# Patient Record
Sex: Male | Born: 1985 | Race: Asian | Hispanic: No | Marital: Single | Smoking: Never smoker
Health system: Southern US, Community
[De-identification: ages and names within clinical notes are randomized; demographics above are authoritative.]

## PROBLEM LIST (undated history)

## (undated) HISTORY — PX: HERNIA REPAIR: SHX51

---

## 2007-02-22 ENCOUNTER — Ambulatory Visit: Payer: Self-pay | Admitting: Internal Medicine

## 2007-12-31 ENCOUNTER — Ambulatory Visit: Payer: Self-pay | Admitting: Family Medicine

## 2008-03-16 ENCOUNTER — Ambulatory Visit: Payer: Self-pay | Admitting: Internal Medicine

## 2010-01-27 ENCOUNTER — Ambulatory Visit: Payer: Self-pay | Admitting: Internal Medicine

## 2010-06-06 ENCOUNTER — Ambulatory Visit: Payer: Self-pay | Admitting: Internal Medicine

## 2012-03-03 ENCOUNTER — Ambulatory Visit: Payer: Self-pay | Admitting: Medical

## 2015-07-04 ENCOUNTER — Encounter: Payer: Self-pay | Admitting: Emergency Medicine

## 2015-07-04 ENCOUNTER — Ambulatory Visit
Admission: EM | Admit: 2015-07-04 | Discharge: 2015-07-04 | Disposition: A | Discharge status: Home / Self Care | Payer: Self-pay | Attending: Family Medicine | Admitting: Family Medicine

## 2015-07-04 DIAGNOSIS — D729 Disorder of white blood cells, unspecified: Secondary | ICD-10-CM

## 2015-07-04 DIAGNOSIS — J111 Influenza due to unidentified influenza virus with other respiratory manifestations: Secondary | ICD-10-CM

## 2015-07-04 DIAGNOSIS — J029 Acute pharyngitis, unspecified: Secondary | ICD-10-CM

## 2015-07-04 LAB — RAPID INFLUENZA A&B ANTIGENS
Influenza A (ARMC): NOT DETECTED — AB
Influenza B (ARMC): NOT DETECTED — AB

## 2015-07-04 LAB — RAPID STREP SCREEN (MED CTR MEBANE ONLY): Streptococcus, Group A Screen (Direct): NEGATIVE

## 2015-07-04 MED ORDER — OSELTAMIVIR PHOSPHATE 75 MG PO CAPS: 75.0000 mg | ORAL_CAPSULE | Freq: Two times a day (BID) | ORAL | Status: DC

## 2015-07-04 MED ORDER — FEXOFENADINE-PSEUDOEPHED ER 180-240 MG PO TB24: 1.0000 | ORAL_TABLET | Freq: Every day | ORAL | Status: DC

## 2015-07-04 MED ORDER — HYDROCOD POLST-CPM POLST ER 10-8 MG/5ML PO SUER: 5.0000 mL | Freq: Two times a day (BID) | ORAL | Status: DC | PRN

## 2015-07-04 MED ORDER — AZITHROMYCIN 250 MG PO TABS: ORAL_TABLET | ORAL | Status: DC

## 2015-07-04 NOTE — Discharge Instructions (Signed)
Cough, Adult A cough helps to clear your throat and lungs. A cough may last only 2-3 weeks (acute), or it may last longer than 8 weeks (chronic). Many different things can cause a cough. A cough may be a sign of an illness or another medical condition. HOME CARE  Pay attention to any changes in your cough.  Take medicines only as told by your doctor.  If you were prescribed an antibiotic medicine, take it as told by your doctor. Do not stop taking it even if you start to feel better.  Talk with your doctor before you try using a cough medicine.  Drink enough fluid to keep your pee (urine) clear or pale yellow.  If the air is dry, use a cold steam vaporizer or humidifier in your home.  Stay away from things that make you cough at work or at home.  If your cough is worse at night, try using extra pillows to raise your head up higher while you sleep.  Do not smoke, and try not to be around smoke. If you need help quitting, ask your doctor.  Do not have caffeine.  Do not drink alcohol.  Rest as needed. GET HELP IF:  You have new problems (symptoms).  You cough up yellow fluid (pus).  Your cough does not get better after 2-3 weeks, or your cough gets worse.  Medicine does not help your cough and you are not sleeping well.  You have pain that gets worse or pain that is not helped with medicine.  You have a fever.  You are losing weight and you do not know why.  You have night sweats. GET HELP RIGHT AWAY IF:  You cough up blood.  You have trouble breathing.  Your heartbeat is very fast.   This information is not intended to replace advice given to you by your health care provider. Make sure you discuss any questions you have with your health care provider.   Document Released: 01/04/2011 Document Revised: 01/12/2015 Document Reviewed: 06/30/2014 Elsevier Interactive Patient Education 2016 Else[employer] Inc[employer]  Influenza, Adult Influenza (flu) is an infection in the mouth,  nose, and throat (respiratory tract) caused by a virus. The flu can make you feel very ill. Influenza spreads easily from person to person (contagious).  HOME CARE   Only take medicines as told by your doctor.  Use a cool mist humidifier to make breathing easier.  Get plenty of rest until your fever goes away. This usually takes 3 to 4 days.  Drink enough fluids to keep your pee (urine) clear or pale yellow.  Cover your mouth and nose when you cough or sneeze.  Wash your hands well to avoid spreading the flu.  Stay home from work or school until your fever has been gone for at least 1 full day.  Get a flu shot every year. GET HELP RIGHT AWAY IF:   You have trouble breathing or feel short of breath.  Your skin or nails turn blue.  You have severe neck pain or stiffness.  You have a severe headache, facial pain, or earache.  Your fever gets worse or keeps coming back.  You feel sick to your stomach (nauseous), throw up (vomit), or have watery poop (diarrhea).  You have chest pain.  You have a deep cough that gets worse, or you cough up more thick spit (mucus). MAKE SURE YOU:   Understand these instructions.  Will watch your condition.  Will get help right away if you  are not doing well or get worse.   This information is not intended to replace advice given to you by your health care provider. Make sure you discuss any questions you have with your health care provider.   Document Released: 01/31/2008 Document Revised: 05/14/2014 Document Reviewed: 07/23/2011 Elsevier Interactive Patient Education 2016 Else[employer] Inc.  Pharyngitis Pharyngitis is a sore throat (pharynx). There is redness, pain, and swelling of your throat. HOME CARE   Drink enough fluids to keep your pee (urine) clear or pale yellow.  Only take medicine as told by your doctor.  You may get sick again if you do not take medicine as told. Finish your medicines, even if you start to feel better.  Do  not take aspirin.  Rest.  Rinse your mouth (gargle) with salt water ( tsp of salt per 1 qt of water) every 1-2 hours. This will help the pain.  If you are not at risk for choking, you can suck on hard candy or sore throat lozenges. GET HELP IF:  You have large, tender lumps on your neck.  You have a rash.  You cough up green, yellow-brown, or bloody spit. GET HELP RIGHT AWAY IF:   You have a stiff neck.  You drool or cannot swallow liquids.  You throw up (vomit) or are not able to keep medicine or liquids down.  You have very bad pain that does not go away with medicine.  You have problems breathing (not from a stuffy nose). MAKE SURE YOU:   Understand these instructions.  Will watch your condition.  Will get help right away if you are not doing well or get worse.   This information is not intended to replace advice given to you by your health care provider. Make sure you discuss any questions you have with your health care provider.   Document Released: 10/10/2007 Document Revised: 02/11/2013 Document Reviewed: 12/29/2012 Elsevier Interactive Patient Education [employer]

## 2015-07-04 NOTE — ED Provider Notes (Signed)
CSN: [id number]     Arrival date & time 07/04/15  1711 History   None   Nurses notes were reviewed. Chief Complaint  Patient presents with  . Fever  . Cough  . Headache    Patient reports being well until Saturday when everything, hit him like a ton of bricks. He reports nasal congestion coughing fever and aching all over. He's also had a sore throat since the aggressive getting worse a few days before anything else hit him. 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 Unfortunately he has not followed up with hematologist oncologist in years. He is allergic to no medication. He did not get his flu shot despite having history of neutropenia.    (Consider location/radiation/quality/duration/timing/severity/associated sxs/prior Treatment) Patient is a 30 y.o. male presenting with fever, cough, and headaches. The history is provided by the patient. No language interpreter was used.  Fever Temp source:  Oral Onset quality:  Sudden Timing:  Constant Progression:  Unable to specify Relieved by:  Nothing Associated symptoms: congestion, cough, headaches and sore throat   Associated symptoms: no ear pain and no rash   Risk factors: immunosuppression   Cough Associated symptoms: fever, headaches and sore throat   Associated symptoms: no ear pain and no rash   Headache Associated symptoms: congestion, cough, fever and sore throat   Associated symptoms: no ear pain     History reviewed. No pertinent past medical history. History reviewed. No pertinent past surgical history. History reviewed. No pertinent family history. Social History  Substance Use Topics  . Smoking status: Never Smoker   . Smokeless tobacco: None  . Alcohol Use: Yes    Review of Systems  Constitutional: Positive for fever.  HENT: Positive for congestion and sore throat. Negative  for ear pain.   Respiratory: Positive for cough.   Skin: Negative for rash.  Neurological: Positive for headaches.  All other systems reviewed and are negative.   Allergies  Review of patient's allergies indicates no known allergies.  Home Medications   Prior to Admission medications   Medication Sig Start Date End Date Taking? Authorizing Provider  azithromycin (ZITHROMAX Z-PAK) 250 MG tablet Take 2 tablets first day and then 1 po a day for 4 days 07/04/15   Hassan Rowan, MD  chlorpheniramine-HYDROcodone [facility]) 10-8 MG/5ML SUER Take 5 mLs by mouth every 12 (twelve) hours as needed for cough. 07/04/15   Hassan Rowan, MD  fexofenadine-pseudoephedrine (ALLEGRA-D ALLERGY & CONGESTION) 180-240 MG 24 hr tablet Take 1 tablet by mouth daily. 07/04/15   Hassan Rowan, MD  oseltamivir (TAMIFLU) 75 MG capsule Take 1 capsule (75 mg total) by mouth 2 (two) times daily. 07/04/15   Hassan Rowan, MD   Meds Ordered and Administered this Visit  Medications - No data to display  BP 145/100 mmHg  Pulse 120  Temp(Src) 97.8 F (36.6 C) (Tympanic)  Resp 16  Ht  (1.651 m)  Wt 180 lb (81.647 kg)  BMI 29.95 kg/m2  SpO2 96% No data found.   Physical Exam  Constitutional: He is oriented to person, place, and time. He appears well-developed and well-nourished.  HENT:  Head: Normocephalic and atraumatic.  Right Ear: Hearing, tympanic membrane, external ear and ear canal normal.  Left Ear: Hearing, tympanic membrane, external ear and ear canal normal.  Nose: Mucosal edema present.  Eyes: Conjunctivae are normal. Pupils are  equal, round, and reactive to light.  Neck: Normal range of motion. Neck supple.  Cardiovascular: Normal rate, regular rhythm and normal heart sounds.   Pulmonary/Chest: Effort normal and breath sounds normal. No respiratory distress.  Musculoskeletal: Normal range of motion.  Lymphadenopathy:    He has cervical adenopathy.  Neurological: He is alert and oriented  to person, place, and time.  Skin: Skin is warm and dry.  Psychiatric: He has a normal mood and affect.  Vitals reviewed.   ED Course  Procedures (including critical care time)  Labs Review Labs Reviewed  RAPID INFLUENZA A&B ANTIGENS ([facility] ONLY) - Abnormal; Notable for the following:    Influenza A [facility]) NOT DETECTED (*)    Influenza B ([facility]) NOT DETECTED (*)    All other components within normal limits  RAPID STREP SCREEN (NOT AT [facility])  CULTURE, GROUP A STREP [facility])    Imaging Review No results found.   Visual Acuity Review  Right Eye Distance:   Left Eye Distance:   Bilateral Distance:    Right Eye Near:   Left Eye Near:    Bilateral Near:     Results for orders placed or performed during the hospital encounter of 07/04/15  Rapid Influenza A&B Antigens ([facility] only)  Result Value Ref Range   Influenza A ([facility]) NOT DETECTED (A) NEGATIVE   Influenza B ([facility]) NOT DETECTED (A) NEGATIVE  Rapid strep screen  Result Value Ref Range   Streptococcus, Group A Screen (Direct) NEGATIVE NEGATIVE      MDM   1. Flu syndrome   2. Pharyngitis   3. Abnormal white blood cell (WBC) count      Because of his history of neutropenia we'll go ahead and treat with an antibiotic Zithromax for the pharyngitis we'll place him on Tamiflu for what is suspected a flulike illness that started Saturday. Flu test was negative but supplied to him the flu test can be negative will give a work note for today and tomorrow as well. 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 Note: This dictation was prepared with Dragon dictation along with smaller phrase technology. Any transcriptional errors that result from this process are unintentional.  Hassan Rowan, MD 07/04/15 2106

## 2015-07-04 NOTE — ED Notes (Signed)
Patient c/o fever, HAs, and cough since Saturday.

## 2015-07-06 LAB — CULTURE, GROUP A STREP (THRC)

## 2019-10-17 ENCOUNTER — Other Ambulatory Visit: Payer: Self-pay

## 2019-10-17 ENCOUNTER — Ambulatory Visit (INDEPENDENT_AMBULATORY_CARE_PROVIDER_SITE_OTHER): Payer: Self-pay

## 2019-10-17 ENCOUNTER — Encounter: Payer: Self-pay | Admitting: Emergency Medicine

## 2019-10-17 ENCOUNTER — Ambulatory Visit
Admission: EM | Admit: 2019-10-17 | Discharge: 2019-10-17 | Disposition: A | Discharge status: Home / Self Care | Payer: Self-pay | Attending: Emergency Medicine | Admitting: Emergency Medicine

## 2019-10-17 DIAGNOSIS — S61259A Open bite of unspecified finger without damage to nail, initial encounter: Secondary | ICD-10-CM

## 2019-10-17 DIAGNOSIS — Z23 Encounter for immunization: Secondary | ICD-10-CM

## 2019-10-17 MED ORDER — TETANUS-DIPHTH-ACELL PERTUSSIS 5-2.5-18.5 LF-MCG/0.5 IM SUSP
0.5000 mL | Freq: Once | INTRAMUSCULAR | Status: AC
  Administered 2019-10-17: 0.5 mL via INTRAMUSCULAR

## 2019-10-17 MED ORDER — CEFTRIAXONE SODIUM 1 G IJ SOLR
1.0000 g | Freq: Once | INTRAMUSCULAR | Status: AC
  Administered 2019-10-17: 1 g via INTRAMUSCULAR

## 2019-10-17 MED ORDER — AMOXICILLIN-POT CLAVULANATE 875-125 MG PO TABS: 1.0000 | ORAL_TABLET | Freq: Two times a day (BID) | ORAL | 0 refills | Status: DC

## 2019-10-17 MED ORDER — MUPIROCIN 2 % EX OINT: 1.0000 "application " | TOPICAL_OINTMENT | Freq: Three times a day (TID) | CUTANEOUS | 0 refills | Status: DC

## 2019-10-17 NOTE — Discharge Instructions (Addendum)
Wet a washcloth under the faucet and then place in the microwave oven for 10 to 15 seconds.  Place the cloth over the finger leaving it there for 10 minutes.  Dry the area thoroughly and apply mupirocin ointment .  Perform this 3-4 times each and every day until the wound has resolved.  Be certain to take all of the antibiotic . Follow-up in our clinic in 2 days for wound recheck.  If symptoms worsen despite the use of the antibiotic go to the emergency room
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
## 2023-12-17 ENCOUNTER — Other Ambulatory Visit: Payer: Self-pay

## 2023-12-17 ENCOUNTER — Ambulatory Visit
Admission: EM | Admit: 2023-12-17 | Discharge: 2023-12-17 | Disposition: A | Discharge status: Home / Self Care | Attending: Physician Assistant | Admitting: Physician Assistant

## 2023-12-17 DIAGNOSIS — I10 Essential (primary) hypertension: Secondary | ICD-10-CM | POA: Insufficient documentation

## 2023-12-17 DIAGNOSIS — R0981 Nasal congestion: Secondary | ICD-10-CM | POA: Diagnosis not present

## 2023-12-17 DIAGNOSIS — R300 Dysuria: Secondary | ICD-10-CM | POA: Diagnosis present

## 2023-12-17 DIAGNOSIS — H1033 Unspecified acute conjunctivitis, bilateral: Secondary | ICD-10-CM | POA: Diagnosis not present

## 2023-12-17 DIAGNOSIS — Z113 Encounter for screening for infections with a predominantly sexual mode of transmission: Secondary | ICD-10-CM | POA: Insufficient documentation

## 2023-12-17 DIAGNOSIS — N390 Urinary tract infection, site not specified: Secondary | ICD-10-CM | POA: Insufficient documentation

## 2023-12-17 LAB — URINALYSIS, W/ REFLEX TO CULTURE (INFECTION SUSPECTED)
Bilirubin Urine: NEGATIVE
Glucose, UA: NEGATIVE mg/dL
Ketones, ur: NEGATIVE mg/dL
Leukocytes,Ua: NEGATIVE
Nitrite: NEGATIVE
Protein, ur: 30 mg/dL — AB
Specific Gravity, Urine: >1.03 — ABNORMAL HIGH (ref 1.005–1.030)
pH: 6 (ref 5.0–8.0)

## 2023-12-17 MED ORDER — NAPHAZOLINE-PHENIRAMINE 0.025-0.3 % OP SOLN: 1.0000 [drp] | Freq: Four times a day (QID) | OPHTHALMIC | 0 refills | Status: AC | PRN

## 2023-12-17 MED ORDER — FLUTICASONE PROPIONATE 50 MCG/ACT NA SUSP: 2.0000 | Freq: Every day | NASAL | 0 refills | Status: AC

## 2023-12-17 MED ORDER — LORATADINE 10 MG PO TABS: 10.0000 mg | ORAL_TABLET | Freq: Every day | ORAL | 0 refills | Status: AC

## 2023-12-17 MED ORDER — SULFAMETHOXAZOLE-TRIMETHOPRIM 800-160 MG PO TABS: 1.0000 | ORAL_TABLET | Freq: Two times a day (BID) | ORAL | 0 refills | Status: AC

## 2023-12-17 NOTE — Discharge Instructions (Addendum)
-  Start using the redness drops,  nasal spray and antihistamines, cool compresses -BP  is high. Please go to [email or web address] or see below and make a PCP appt about high BP.  -STI testing will be back in a couple of days and we will call if any positive results  [facility] at [facility], [address], [address]. Phone number: [phone] Please call this number to establish with primary care   UTI: Based on either symptoms or urinalysis, you may have a urinary tract infection. We will send the urine for culture and call with results in a few days. Begin antibiotics at this time. Your symptoms should be much improved over the next 2-3 days. Increase rest and fluid intake. If for some reason symptoms are worsening or not improving after a couple of days or the urine culture determines the antibiotics you are taking will not treat the infection, the antibiotics may be changed. Return or go to ER for fever, back pain, worsening urinary pain, discharge, increased blood in urine. May take Tylenol or Motrin OTC for pain relief or consider AZO if no contraindications

## 2023-12-17 NOTE — ED Triage Notes (Signed)
 Pt is here with bilateral eye irritation and wateriness that started 2 days, pt has taken OTC eye drops to relieve discomfort.

## 2023-12-17 NOTE — ED Provider Notes (Signed)
 [facility]    CSN: [id number] Arrival date & time: 12/17/23  1424      History   Chief Complaint Chief Complaint  Patient presents with   eye irritation    HPI Ryan Lara is a 38 y.o. male presenting for 2 separate complaints.  Patient reports redness of both eyes, irritation/pruritus and watering with nasal congestion for the past couple days.  Denies fever, cough, sore throat.  Patient does report alcohol use and consumption of marijuana.  States his eyes get red with those activities but this feels different.  He has used Visine drops over-the-counter without relief.  He is also reporting dysuria over the past couple of days.  Denies penile discharge, testicular pain or swelling.  Patient states he messed with this girl last week and symptoms have been present after that.  He would like STI screening as well.  HPI  History reviewed. No pertinent past medical history.  There are no active problems to display for this patient.   Past Surgical History:  Procedure Laterality Date   HERNIA REPAIR         Home Medications    Prior to Admission medications   Medication Sig Start Date End Date Taking? Authorizing Provider  albuterol (VENTOLIN HFA) 108 (90 Base) MCG/ACT inhaler Inhale 2 puffs into the lungs. 03/21/18  Yes [provider]  amoxicillin -clavulanate (AUGMENTIN ) 500-125 MG tablet Take 1 tablet by mouth 2 (two) times daily. 12/13/23  Yes [provider]  fluticasone  (FLONASE ) 50 MCG/ACT nasal spray Place 2 sprays into both nostrils daily. 12/17/23  Yes Arvis Jolan NOVAK, PA-C  loratadine  (CLARITIN ) 10 MG tablet Take 1 tablet (10 mg total) by mouth daily. 12/17/23  Yes Arvis Jolan NOVAK, PA-C  naphazoline-pheniramine (NAPHCON-A) 0.025-0.3 % ophthalmic solution Place 1 drop into both eyes 4 (four) times daily as needed for eye irritation. 12/17/23  Yes Arvis Jolan B, PA-C  sulfamethoxazole -trimethoprim  (BACTRIM  DS) 800-160 MG tablet Take  1 tablet by mouth 2 (two) times daily for 7 days. 12/17/23 12/24/23 Yes Arvis Jolan NOVAK, PA-C    Family History Family History  Problem Relation Age of Onset   Healthy Mother     Social History Social History   Tobacco Use   Smoking status: Never   Smokeless tobacco: Never  Vaping Use   Vaping status: Never Used  Substance Use Topics   Alcohol use: Yes   Drug use: Yes    Types: Marijuana     Allergies   Patient has no known allergies.   Review of Systems Review of Systems  Constitutional:  Negative for fatigue and fever.  HENT:  Positive for congestion, rhinorrhea and sneezing. Negative for facial swelling and sore throat.   Eyes:  Positive for pain, discharge and redness. Negative for photophobia, itching and visual disturbance.  Respiratory:  Negative for cough.   Gastrointestinal:  Negative for abdominal pain, nausea and vomiting.  Genitourinary:  Positive for dysuria. Negative for frequency, genital sores, hematuria, penile discharge, penile pain, penile swelling, scrotal swelling, testicular pain and urgency.  Musculoskeletal:  Negative for arthralgias.  Skin:  Negative for rash.  Neurological:  Negative for dizziness, weakness and headaches.     Physical Exam Triage Vital Signs ED Triage Vitals  Encounter Vitals Group     BP      Girls Systolic BP Percentile      Girls Diastolic BP Percentile      Boys Systolic BP Percentile      Boys  Diastolic BP Percentile      Pulse      Resp      Temp      Temp src      SpO2      Weight      Height      Head Circumference      Peak Flow      Pain Score      Pain Loc      Pain Education      Exclude from Growth Chart    No data found.  Updated Vital Signs BP (!) 152/116 (BP Location: Left Arm)   Pulse 90   Temp 98.2 F (36.8 C) (Oral)   Resp 17   SpO2 100%   Visual Acuity Right Eye Distance:   Left Eye Distance:   Bilateral Distance:    Physical Exam Vitals and nursing note reviewed.   Constitutional:      General: He is not in acute distress.    Appearance: Normal appearance. He is well-developed. He is not ill-appearing.  HENT:     Head: Normocephalic and atraumatic.     Nose: Congestion present.     Mouth/Throat:     Mouth: Mucous membranes are moist.     Pharynx: Oropharynx is clear.  Eyes:     General: No scleral icterus.    Conjunctiva/sclera:     Right eye: Right conjunctiva is injected.     Left eye: Left conjunctiva is injected.  Cardiovascular:     Rate and Rhythm: Normal rate and regular rhythm.  Pulmonary:     Effort: Pulmonary effort is normal. No respiratory distress.     Breath sounds: Normal breath sounds.  Musculoskeletal:     Cervical back: Neck supple.  Skin:    General: Skin is warm and dry.     Capillary Refill: Capillary refill takes less than 2 seconds.  Neurological:     General: No focal deficit present.     Mental Status: He is alert. Mental status is at baseline.     Motor: No weakness.     Gait: Gait normal.  Psychiatric:        Mood and Affect: Mood normal.        Behavior: Behavior normal.      UC Treatments / Results  Labs (all labs ordered are listed, but only abnormal results are displayed) Labs Reviewed  URINALYSIS, W/ REFLEX TO CULTURE (INFECTION SUSPECTED) - Abnormal; Notable for the following components:      Result Value   Specific Gravity, Urine >1.030 (*)    Hgb urine dipstick TRACE (*)    Protein, ur 30 (*)    Bacteria, UA FEW (*)    All other components within normal limits  URINE CULTURE  CYTOLOGY, (ORAL, ANAL, URETHRAL) ANCILLARY ONLY    EKG   Radiology No results found.  Procedures Procedures (including critical care time)  Medications Ordered in UC Medications - No data to display  Initial Impression / Assessment and Plan / UC Course  I have reviewed the triage vital signs and the nursing notes.  Pertinent labs & imaging results that were available during my care of the patient were  reviewed by me and considered in my medical decision making (see chart for details).   38 year old male presents for multiple complaints.  Has had bilateral eye redness and nasal congestion.  No drainage from eyes or eye pain.  Patient does report using alcohol and smoking marijuana.  Also reports  dysuria and would like STI testing.  Recently had intercourse with a new partner.  Blood pressure is elevated at 176/111.  Recheck is 152/116.  Patient has history of hypertension.  Not on any antihypertensives.  We discussed following up with primary care provider and nursing staff show patient had a scheduled appointment.  Advised keeping log of BP and following up with PCP.  Urinalysis obtained today.  Ordered cytology swab for GC/chlamydia and gonorrhea.  Conjunctivitis, allergic versus viral.  Sent Naphcon-A, Flonase  and Claritin .  Supportive care.  Follow-up as needed.  UA shows: trace hgb, bacteria, clumps of white blood cells, and mucus. Appears to be a clean catch. Urine reflexed to culture. Possible UTI. Currently on Augmentin  for a dental issue, so I sent Bactrim  DS. Advised patient we may amend treatment based on urine culture and cytology swab results. Patient understanding.    Final Clinical Impressions(s) / UC Diagnoses   Final diagnoses:  Acute conjunctivitis of both eyes, unspecified acute conjunctivitis type  Nasal congestion  Dysuria  Routine screening for STI (sexually transmitted infection)  Essential hypertension  Urinary tract infection without hematuria, site unspecified     Discharge Instructions      -Start using the redness drops,  nasal spray and antihistamines, cool compresses -BP  is high. Please go to [email or web address] or see below and make a PCP appt about high BP.  -STI testing will be back in a couple of days and we will call if any positive results  [facility] at [facility], [address], [address]. Phone number: [phone] Please call  this number to establish with primary care   UTI: Based on either symptoms or urinalysis, you may have a urinary tract infection. We will send the urine for culture and call with results in a few days. Begin antibiotics at this time. Your symptoms should be much improved over the next 2-3 days. Increase rest and fluid intake. If for some reason symptoms are worsening or not improving after a couple of days or the urine culture determines the antibiotics you are taking will not treat the infection, the antibiotics may be changed. Return or go to ER for fever, back pain, worsening urinary pain, discharge, increased blood in urine. May take Tylenol or Motrin OTC for pain relief or consider AZO if no contraindications      ED Prescriptions     Medication Sig Dispense Auth. Provider   naphazoline-pheniramine (NAPHCON-A) 0.025-0.3 % ophthalmic solution Place 1 drop into both eyes 4 (four) times daily as needed for eye irritation. 15 mL Arvis Huxley B, PA-C   loratadine  (CLARITIN ) 10 MG tablet Take 1 tablet (10 mg total) by mouth daily. 30 tablet Arvis Huxley B, PA-C   fluticasone  (FLONASE ) 50 MCG/ACT nasal spray Place 2 sprays into both nostrils daily. 1 g Arvis Huxley B, PA-C   sulfamethoxazole -trimethoprim  (BACTRIM  DS) 800-160 MG tablet Take 1 tablet by mouth 2 (two) times daily for 7 days. 14 tablet Demarr Kluever B, PA-C      PDMP not reviewed this encounter.   Arvis Huxley NOVAK, PA-C 12/17/23 1702

## 2023-12-18 LAB — CYTOLOGY, (ORAL, ANAL, URETHRAL) ANCILLARY ONLY
Chlamydia: NEGATIVE
Comment: NEGATIVE
Comment: NEGATIVE
Comment: NORMAL
Neisseria Gonorrhea: NEGATIVE
Trichomonas: NEGATIVE

## 2023-12-19 ENCOUNTER — Ambulatory Visit (HOSPITAL_COMMUNITY): Payer: Self-pay

## 2023-12-19 LAB — URINE CULTURE: Culture: NO GROWTH
